# Patient Record
Sex: Male | Born: 1975 | Race: White | Hispanic: No | Marital: Married | Smoking: Never smoker
Health system: Southern US, Community
[De-identification: ages and names within clinical notes are randomized; demographics above are authoritative.]

---

## 2013-04-23 ENCOUNTER — Ambulatory Visit: Payer: Self-pay | Admitting: Internal Medicine

## 2013-04-23 LAB — COMPREHENSIVE METABOLIC PANEL
ALBUMIN: 4.3 g/dL (ref 3.4–5.0)
ALT: 47 U/L (ref 12–78)
AST: 24 U/L (ref 15–37)
Alkaline Phosphatase: 75 U/L
Anion Gap: 7 (ref 7–16)
BUN: 16 mg/dL (ref 7–18)
Bilirubin,Total: 0.5 mg/dL (ref 0.2–1.0)
CHLORIDE: 104 mmol/L (ref 98–107)
CREATININE: 0.97 mg/dL (ref 0.60–1.30)
Calcium, Total: 8.8 mg/dL (ref 8.5–10.1)
Co2: 27 mmol/L (ref 21–32)
EGFR (African American): >60
EGFR (Non-African Amer.): >60
Glucose: 93 mg/dL (ref 65–99)
OSMOLALITY: 277 (ref 275–301)
POTASSIUM: 3.9 mmol/L (ref 3.5–5.1)
SODIUM: 138 mmol/L (ref 136–145)
TOTAL PROTEIN: 7.7 g/dL (ref 6.4–8.2)

## 2013-04-23 LAB — CBC WITH DIFFERENTIAL/PLATELET
BASOS ABS: 0.1 10*3/uL (ref 0.0–0.1)
Basophil %: 0.9 %
Eosinophil #: 0.2 10*3/uL (ref 0.0–0.7)
Eosinophil %: 2.7 %
HCT: 41.2 % (ref 40.0–52.0)
HGB: 14.4 g/dL (ref 13.0–18.0)
Lymphocyte #: 2.3 10*3/uL (ref 1.0–3.6)
Lymphocyte %: 25.8 %
MCH: 30.2 pg (ref 26.0–34.0)
MCHC: 34.9 g/dL (ref 32.0–36.0)
MCV: 87 fL (ref 80–100)
Monocyte #: 0.8 x10 3/mm (ref 0.2–1.0)
Monocyte %: 8.7 %
NEUTROS ABS: 5.6 10*3/uL (ref 1.4–6.5)
Neutrophil %: 61.9 %
PLATELETS: 191 10*3/uL (ref 150–440)
RBC: 4.76 10*6/uL (ref 4.40–5.90)
RDW: 12.6 % (ref 11.5–14.5)
WBC: 9 10*3/uL (ref 3.8–10.6)

## 2013-04-23 LAB — TSH: THYROID STIMULATING HORM: 1.82 u[IU]/mL

## 2013-04-23 LAB — MAGNESIUM: MAGNESIUM: 2.3 mg/dL

## 2016-06-14 ENCOUNTER — Encounter: Payer: Self-pay | Admitting: Emergency Medicine

## 2016-06-14 ENCOUNTER — Ambulatory Visit (INDEPENDENT_AMBULATORY_CARE_PROVIDER_SITE_OTHER): Payer: No Typology Code available for payment source

## 2016-06-14 ENCOUNTER — Ambulatory Visit
Admission: EM | Admit: 2016-06-14 | Discharge: 2016-06-14 | Disposition: A | Discharge status: Home / Self Care | Payer: No Typology Code available for payment source | Attending: Family Medicine | Admitting: Family Medicine

## 2016-06-14 DIAGNOSIS — M5432 Sciatica, left side: Secondary | ICD-10-CM | POA: Diagnosis not present

## 2016-06-14 DIAGNOSIS — M5431 Sciatica, right side: Secondary | ICD-10-CM | POA: Diagnosis not present

## 2016-06-14 DIAGNOSIS — T148XXA Other injury of unspecified body region, initial encounter: Secondary | ICD-10-CM | POA: Diagnosis not present

## 2016-06-14 DIAGNOSIS — M62830 Muscle spasm of back: Secondary | ICD-10-CM | POA: Diagnosis not present

## 2016-06-14 MED ORDER — MELOXICAM 15 MG PO TABS: 15.0000 mg | ORAL_TABLET | Freq: Every day | ORAL | 0 refills | Status: AC

## 2016-06-14 MED ORDER — KETOROLAC TROMETHAMINE 60 MG/2ML IM SOLN
60.0000 mg | Freq: Once | INTRAMUSCULAR | Status: AC
  Administered 2016-06-14: 60 mg via INTRAMUSCULAR

## 2016-06-14 MED ORDER — PREDNISONE 10 MG (21) PO TBPK: ORAL_TABLET | ORAL | 0 refills | Status: AC

## 2016-06-14 MED ORDER — ORPHENADRINE CITRATE ER 100 MG PO TB12: 100.0000 mg | ORAL_TABLET | Freq: Two times a day (BID) | ORAL | 0 refills | Status: AC

## 2016-06-14 NOTE — ED Triage Notes (Signed)
Patient c/o lower back pain that started on Sunday.  Patient reports using a shovel on Sunday.

## 2016-06-14 NOTE — ED Provider Notes (Signed)
[facility]    CSN: [id number] Arrival date & time: 06/14/16  1049     History   Chief Complaint Chief Complaint  Patient presents with  . Back Pain    HPI Max Green is a 41 y.o. male.   Patient reports back pain started Sunday after doing some shoveling at home states that after the 50th shovel lethargic he felt was back pain. Pain radiated down the hip area or buttocks area on both legs. States Sunday was a 10 out of 10 yesterday was more like 89 today's more likely 7. He was seen in [facility] yesterday given TENS unit and acupuncture treatment. His hemoglobin appointment to go back to see them later today. Past medical history denies any chronic medical problems no previous surgeries or operations no pertinent family medical history relevant today's visit pain does not smoke. No known drug allergies.   The history is provided by the patient and the spouse.  Back Pain  Location:  Thoracic spine and sacro-iliac joint Quality:  Aching Radiates to: both gluetyal muscle area. Pain severity:  Severe Pain is:  Same all the time Onset quality:  Sudden Duration:  3 days Timing:  Constant Progression:  Improving Chronicity:  New Context: lifting heavy objects, physical stress and twisting   Context: not emotional stress, not falling and not MCA   Relieved by:  None tried   History reviewed. No pertinent past medical history.  There are no active problems to display for this patient.   History reviewed. No pertinent surgical history.     Home Medications    Prior to Admission medications   Medication Sig Start Date End Date Taking? Authorizing Provider  meloxicam (MOBIC) 15 MG tablet Take 1 tablet (15 mg total) by mouth daily. 06/14/16   Hassan RowanWade, Emaly Boschert, MD  orphenadrine (NORFLEX) 100 MG tablet Take 1 tablet (100 mg total) by mouth 2 (two) times daily. 06/14/16   Hassan RowanWade, Nelva Hauk, MD  predniSONE (STERAPRED UNI-PAK 21 TAB) 10 MG (21) TBPK tablet 6 tabs  day 1 and 2, 5 tabs day 3 and 4, 4 tabs day 5 and 6, 3 tabs day 7 and 8, 2 tabs day 9 and 10, 1 tab day 11 and 12. Take orally 06/14/16   Hassan RowanWade, Arrie Borrelli, MD    Family History History reviewed. No pertinent family history.  Social History Social History  Substance Use Topics  . Smoking status: Never Smoker  . Smokeless tobacco: Never Used  . Alcohol use No     Allergies   Patient has no known allergies.   Review of Systems Review of Systems  Musculoskeletal: Positive for back pain. Negative for gait problem.  All other systems reviewed and are negative.    Physical Exam Triage Vital Signs ED Triage Vitals  Enc Vitals Group     BP 06/14/16 1156 105/67     Pulse Rate 06/14/16 1156 60     Resp 06/14/16 1156 16     Temp 06/14/16 1156 98.7 F (37.1 C)     Temp Source 06/14/16 1156 Oral     SpO2 06/14/16 1156 100 %     Weight 06/14/16 1154 195 lb (88.5 kg)     Height 06/14/16 1154 6' (1.829 m)     Head Circumference --      Peak Flow --      Pain Score 06/14/16 1154 7     Pain Loc --      Pain Edu? --  Excl. in GC? --    No data found.   Updated Vital Signs BP 105/67 (BP Location: Left Arm)   Pulse 60   Temp 98.7 F (37.1 C) (Oral)   Resp 16   Ht 6' (1.829 m)   Wt 195 lb (88.5 kg)   SpO2 100%   BMI 26.45 kg/m   Visual Acuity Right Eye Distance:   Left Eye Distance:   Bilateral Distance:    Right Eye Near:   Left Eye Near:    Bilateral Near:     Physical Exam  Constitutional: He is oriented to person, place, and time. Vital signs are normal. He appears well-developed and well-nourished.  Non-toxic appearance. He does not have a sickly appearance. He does not appear ill. He appears distressed.  HENT:  Head: Normocephalic and atraumatic.  Right Ear: External ear normal.  Left Ear: External ear normal.  Mouth/Throat: Oropharynx is clear and moist.  Eyes: Pupils are equal, round, and reactive to light.  Neck: Normal range of motion. Neck supple.    Pulmonary/Chest: Effort normal.  Musculoskeletal: He exhibits tenderness.       Lumbar back: He exhibits tenderness, bony tenderness and spasm.       Back:  Neurological: He is alert and oriented to person, place, and time.  Skin: Skin is warm.  Psychiatric: He has a normal mood and affect.  Vitals reviewed.    UC Treatments / Results  Labs (all labs ordered are listed, but only abnormal results are displayed) Labs Reviewed - No data to display  EKG  EKG Interpretation None       Radiology Dg Lumbar Spine Complete  Result Date: 06/14/2016 CLINICAL DATA:  Back pain EXAM: LUMBAR SPINE - COMPLETE 4+ VIEW COMPARISON:  None. FINDINGS: There is no evidence of lumbar spine fracture. Alignment is normal. Intervertebral disc spaces are maintained. IMPRESSION: Negative. Electronically Signed   By: Marlan Palau M.D.   On: 06/14/2016 14:00   Dg Si Joints  Result Date: 06/14/2016 CLINICAL DATA:  Back pain EXAM: BILATERAL SACROILIAC JOINTS - 3+ VIEW COMPARISON:  None. FINDINGS: The sacroiliac joint spaces are maintained and there is no evidence of arthropathy. No other bone abnormalities are seen. IMPRESSION: Negative. Electronically Signed   By: Marlan Palau M.D.   On: 06/14/2016 14:01    Procedures Procedures (including critical care time)  Medications Ordered in UC Medications  ketorolac (TORADOL) injection 60 mg (60 mg Intramuscular Given 06/14/16 1356)     Initial Impression / Assessment and Plan / UC Course  I have reviewed the triage vital signs and the nursing notes.  Pertinent labs & imaging results that were available during my care of the patient were reviewed by me and considered in my medical decision making (see chart for details).   will treat with meloxicam Norflex 12 day course of prednisone follow-up with Dr. Duwayne Heck for more manipulation spine and treatment. He did state that the Toradol injection given to him did seem to help. X-rays were essentially  negative.    Final Clinical Impressions(s) / UC Diagnoses   Final diagnoses:  Muscle spasm of back  Muscle strain  Bilateral sciatica    New Prescriptions Discharge Medication List as of 06/14/2016  2:09 PM    START taking these medications   Details  meloxicam (MOBIC) 15 MG tablet Take 1 tablet (15 mg total) by mouth daily., Starting Tue 06/14/2016, Normal    orphenadrine (NORFLEX) 100 MG tablet Take 1 tablet (100 mg total)  by mouth 2 (two) times daily., Starting Tue 06/14/2016, Normal    predniSONE (STERAPRED UNI-PAK 21 TAB) 10 MG (21) TBPK tablet 6 tabs day 1 and 2, 5 tabs day 3 and 4, 4 tabs day 5 and 6, 3 tabs day 7 and 8, 2 tabs day 9 and 10, 1 tab day 11 and 12. Take orally, Normal         Hassan Rowan, MD 06/14/16 2131

## 2018-08-09 IMAGING — CR DG LUMBAR SPINE COMPLETE 4+V
5 series · 5 of 5 positions shown · non-contrast
Comparison: None.

CLINICAL DATA: Back pain

EXAM:
LUMBAR SPINE - COMPLETE 4+ VIEW

[l-spine ap]
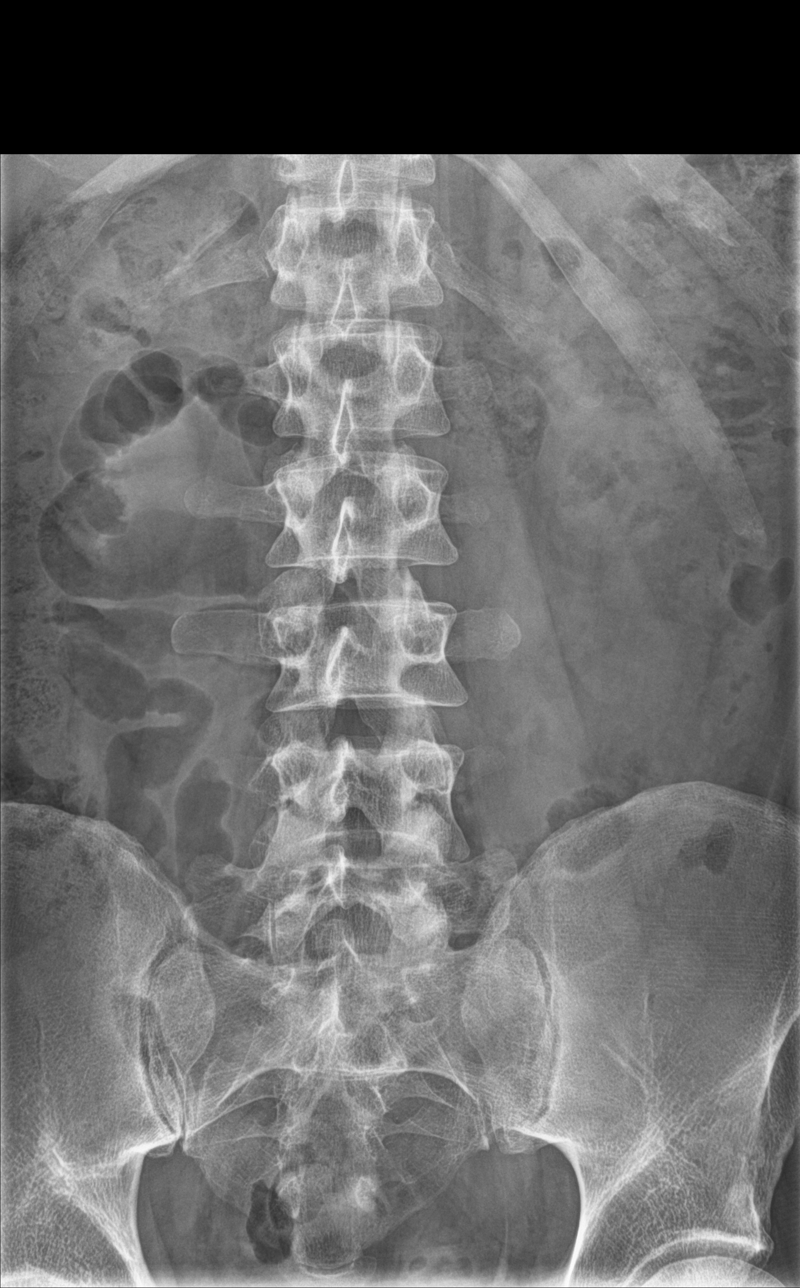

[l-spine obl (1 of 2)]
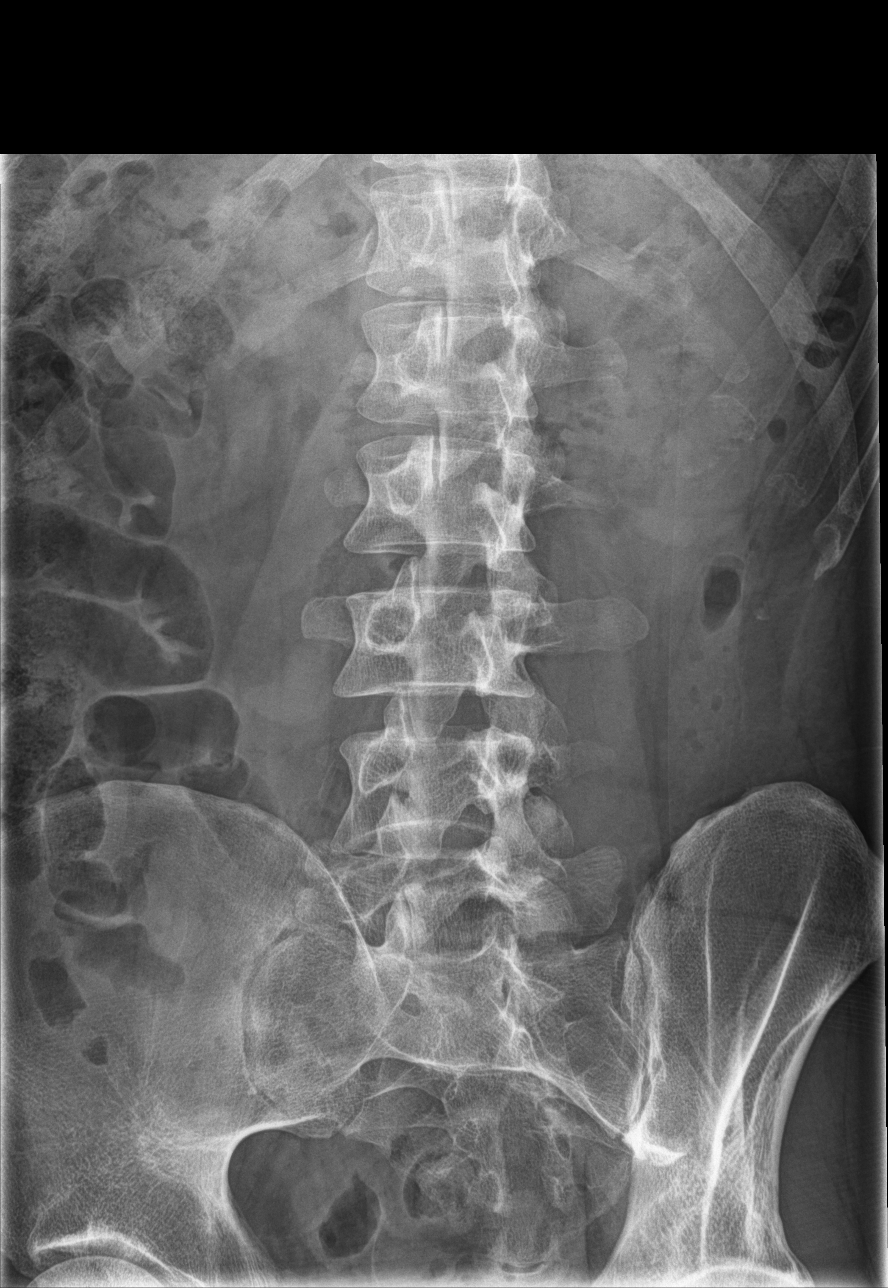

[l-spine obl (2 of 2)]
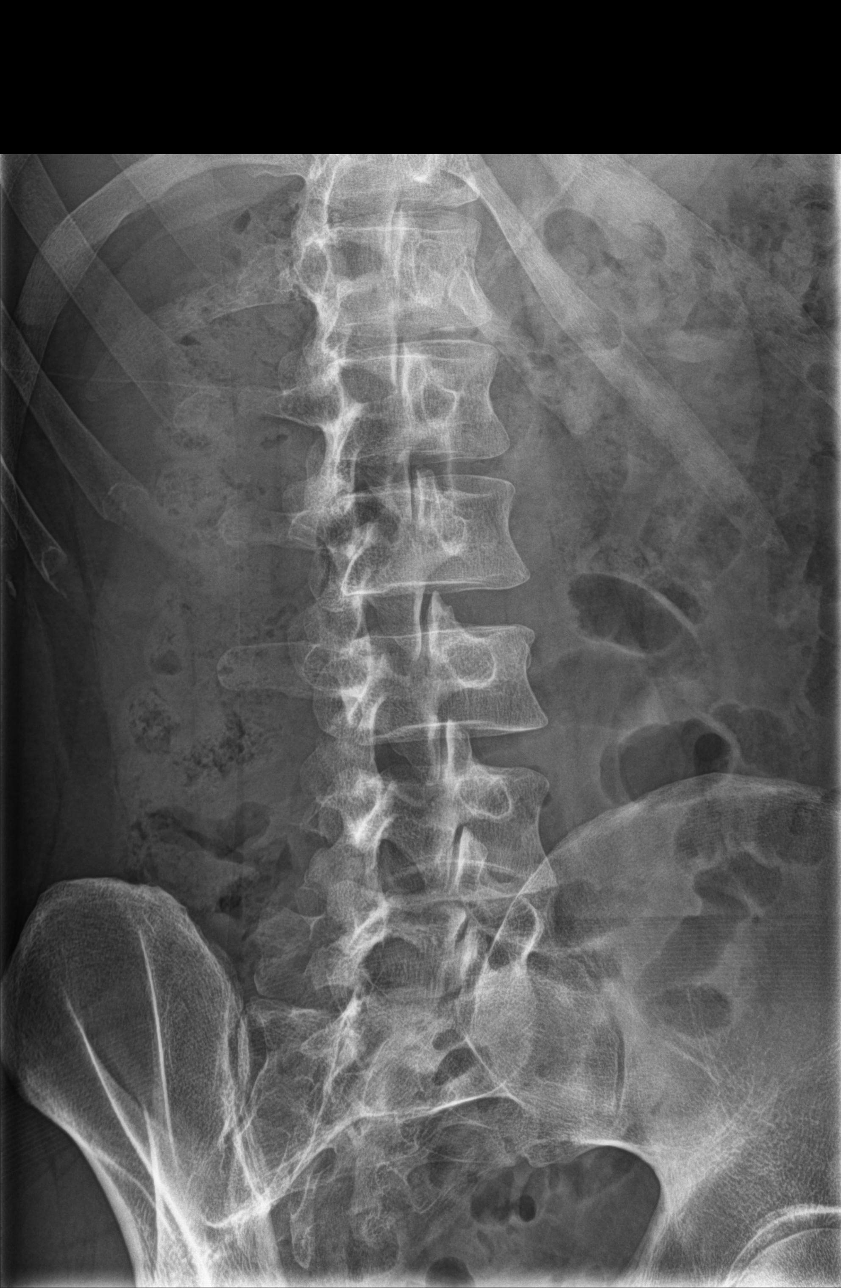

[l-spine lat]
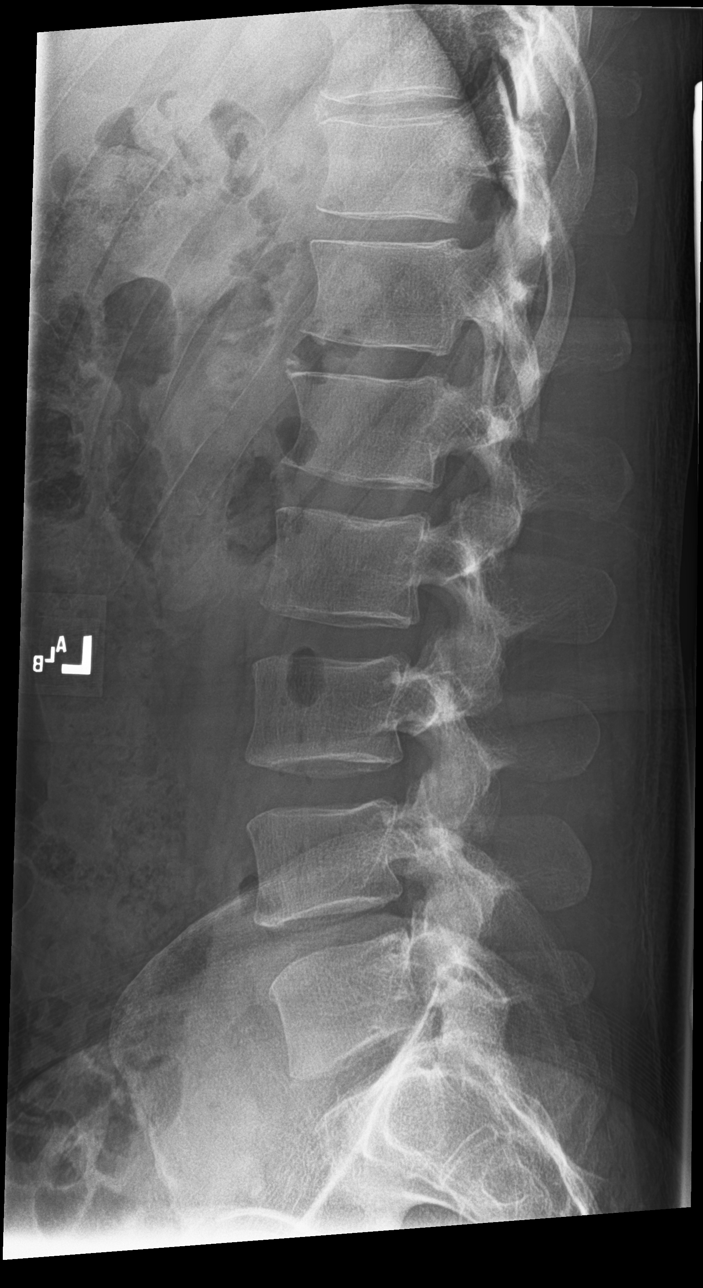

[l-spine spot]
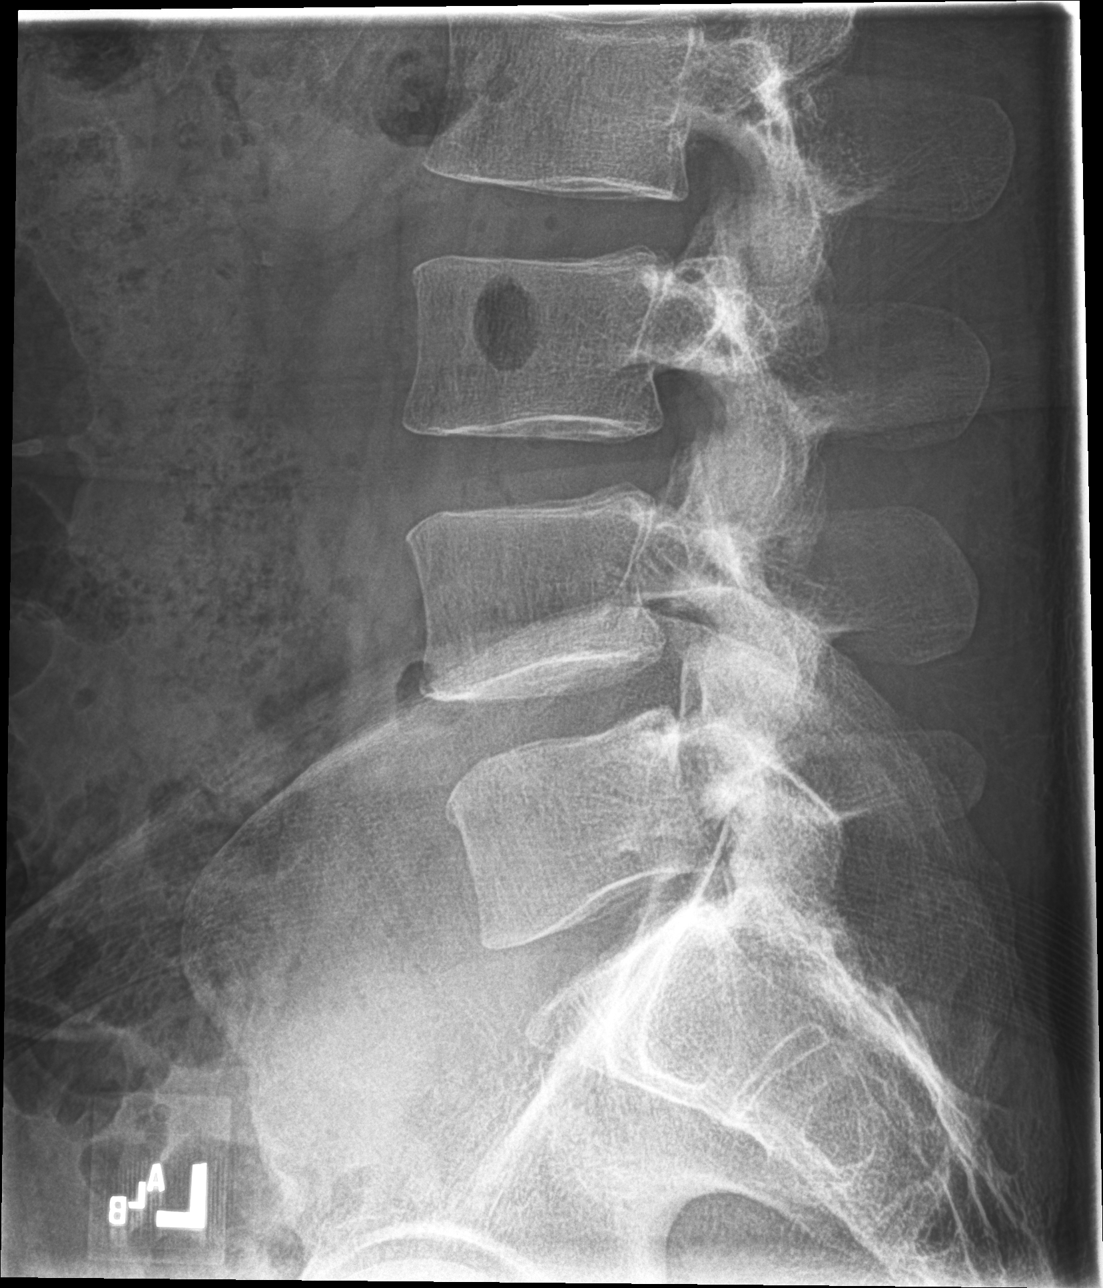

[5 of 5 positions shown; findings below may reference images not displayed]

FINDINGS: There is no evidence of lumbar spine fracture. Alignment is normal.
Intervertebral disc spaces are maintained.
IMPRESSION: Negative.
